# Patient Record
Sex: Male | Born: 1985 | Hispanic: Yes | Marital: Single | Smoking: Former smoker
Health system: Southern US, Community
[De-identification: ages and names within clinical notes are randomized; demographics above are authoritative.]

## PROBLEM LIST (undated history)

## (undated) DIAGNOSIS — I10 Essential (primary) hypertension: Secondary | ICD-10-CM

---

## 2014-01-11 ENCOUNTER — Emergency Department (HOSPITAL_COMMUNITY): Payer: No Typology Code available for payment source

## 2014-01-11 ENCOUNTER — Encounter (HOSPITAL_COMMUNITY): Payer: Self-pay | Admitting: Emergency Medicine

## 2014-01-11 ENCOUNTER — Emergency Department (HOSPITAL_COMMUNITY)
Admission: EM | Admit: 2014-01-11 | Discharge: 2014-01-11 | Disposition: A | Discharge status: Home / Self Care | Payer: No Typology Code available for payment source | Attending: Emergency Medicine | Admitting: Emergency Medicine

## 2014-01-11 DIAGNOSIS — S8990XA Unspecified injury of unspecified lower leg, initial encounter: Secondary | ICD-10-CM | POA: Insufficient documentation

## 2014-01-11 DIAGNOSIS — IMO0002 Reserved for concepts with insufficient information to code with codable children: Secondary | ICD-10-CM | POA: Insufficient documentation

## 2014-01-11 DIAGNOSIS — Z87891 Personal history of nicotine dependence: Secondary | ICD-10-CM | POA: Diagnosis not present

## 2014-01-11 DIAGNOSIS — S79919A Unspecified injury of unspecified hip, initial encounter: Secondary | ICD-10-CM | POA: Insufficient documentation

## 2014-01-11 DIAGNOSIS — Y9301 Activity, walking, marching and hiking: Secondary | ICD-10-CM | POA: Diagnosis not present

## 2014-01-11 DIAGNOSIS — I10 Essential (primary) hypertension: Secondary | ICD-10-CM | POA: Diagnosis not present

## 2014-01-11 DIAGNOSIS — S99919A Unspecified injury of unspecified ankle, initial encounter: Principal | ICD-10-CM

## 2014-01-11 DIAGNOSIS — S99929A Unspecified injury of unspecified foot, initial encounter: Secondary | ICD-10-CM | POA: Diagnosis not present

## 2014-01-11 DIAGNOSIS — S8992XA Unspecified injury of left lower leg, initial encounter: Secondary | ICD-10-CM

## 2014-01-11 DIAGNOSIS — Y92410 Unspecified street and highway as the place of occurrence of the external cause: Secondary | ICD-10-CM | POA: Diagnosis not present

## 2014-01-11 DIAGNOSIS — M549 Dorsalgia, unspecified: Secondary | ICD-10-CM

## 2014-01-11 DIAGNOSIS — S79929A Unspecified injury of unspecified thigh, initial encounter: Secondary | ICD-10-CM

## 2014-01-11 HISTORY — DX: Essential (primary) hypertension: I10

## 2014-01-11 MED ORDER — ACETAMINOPHEN 500 MG PO TABS
1000.0000 mg | ORAL_TABLET | Freq: Once | ORAL | Status: AC
  Administered 2014-01-11: 1000 mg via ORAL
  Filled 2014-01-11: qty 2

## 2014-01-11 NOTE — Discharge Instructions (Signed)
Back Pain, Adult Low back pain is very common. About 1 in 5 people have back pain.The cause of low back pain is rarely dangerous. The pain often gets better over time.About half of people with a sudden onset of back pain feel better in just 2 weeks. About 8 in 10 people feel better by 6 weeks.  CAUSES Some common causes of back pain include:  Strain of the muscles or ligaments supporting the spine.  Wear and tear (degeneration) of the spinal discs.  Arthritis.  Direct injury to the back. DIAGNOSIS Most of the time, the direct cause of low back pain is not known.However, back pain can be treated effectively even when the exact cause of the pain is unknown.Answering your caregiver's questions about your overall health and symptoms is one of the most accurate ways to make sure the cause of your pain is not dangerous. If your caregiver needs more information, he or she may order lab work or imaging tests (X-rays or MRIs).However, even if imaging tests show changes in your back, this usually does not require surgery. HOME CARE INSTRUCTIONS For many people, back pain returns.Since low back pain is rarely dangerous, it is often a condition that people can learn to manageon their own.   Remain active. It is stressful on the back to sit or stand in one place. Do not sit, drive, or stand in one place for more than 30 minutes at a time. Take short walks on level surfaces as soon as pain allows.Try to increase the length of time you walk each day.  Do not stay in bed.Resting more than 1 or 2 days can delay your recovery.  Do not avoid exercise or work.Your body is made to move.It is not dangerous to be active, even though your back may hurt.Your back will likely heal faster if you return to being active before your pain is gone.  Pay attention to your body when you bend and lift. Many people have less discomfortwhen lifting if they bend their knees, keep the load close to their bodies,and  avoid twisting. Often, the most comfortable positions are those that put less stress on your recovering back.  Find a comfortable position to sleep. Use a firm mattress and lie on your side with your knees slightly bent. If you lie on your back, put a pillow under your knees.  Only take over-the-counter or prescription medicines as directed by your caregiver. Over-the-counter medicines to reduce pain and inflammation are often the most helpful.Your caregiver may prescribe muscle relaxant drugs.These medicines help dull your pain so you can more quickly return to your normal activities and healthy exercise.  Put ice on the injured area.  Put ice in a plastic bag.  Place a towel between your skin and the bag.  Leave the ice on for 15-20 minutes, 03-04 times a day for the first 2 to 3 days. After that, ice and heat may be alternated to reduce pain and spasms.  Ask your caregiver about trying back exercises and gentle massage. This may be of some benefit.  Avoid feeling anxious or stressed.Stress increases muscle tension and can worsen back pain.It is important to recognize when you are anxious or stressed and learn ways to manage it.Exercise is a great option. SEEK MEDICAL CARE IF:  You have pain that is not relieved with rest or medicine.  You have pain that does not improve in 1 week.  You have new symptoms.  You are generally not feeling well. SEEK   IMMEDIATE MEDICAL CARE IF:   You have pain that radiates from your back into your legs.  You develop new bowel or bladder control problems.  You have unusual weakness or numbness in your arms or legs.  You develop nausea or vomiting.  You develop abdominal pain.  You feel faint. Document Released: 08/25/2005 Document Revised: 02/24/2012 Document Reviewed: 01/13/2011 [employer] Patient Information 2014 [employer].  
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
## 2014-01-11 NOTE — ED Notes (Signed)
Initial contact - pt ambulatory from bathroom with steady gait, continues to c/o mild pain to LLE.  No obvious deformities or injuries noted.  A+Ox4.  NAD.
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
